# Patient Record
Sex: Male | Born: 1990 | Race: White | Hispanic: No | Marital: Single | Smoking: Current every day smoker
Health system: Southern US, Community
[De-identification: ages and names within clinical notes are randomized; demographics above are authoritative.]

## PROBLEM LIST (undated history)

## (undated) DIAGNOSIS — F909 Attention-deficit hyperactivity disorder, unspecified type: Secondary | ICD-10-CM

---
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
## 2011-01-06 ENCOUNTER — Emergency Department (HOSPITAL_COMMUNITY)
Admission: EM | Admit: 2011-01-06 | Discharge: 2011-01-06 | Payer: Self-pay | Source: Home / Self Care | Admitting: Emergency Medicine

## 2011-09-08 LAB — URINALYSIS, ROUTINE W REFLEX MICROSCOPIC
Ketones, ur: NEGATIVE
Leukocytes, UA: NEGATIVE
Nitrite: NEGATIVE
Protein, ur: NEGATIVE
Urobilinogen, UA: 1

## 2011-09-08 LAB — CBC
HCT: 43.3
MCV: 84.9
Platelets: 144 — ABNORMAL LOW
RDW: 13.1
WBC: 5.1

## 2011-09-08 LAB — DIFFERENTIAL
Basophils Absolute: 0
Eosinophils Absolute: 0
Eosinophils Relative: 0
Lymphs Abs: 0.6 — ABNORMAL LOW
Neutrophils Relative %: 76 — ABNORMAL HIGH

## 2011-09-08 LAB — URINE MICROSCOPIC-ADD ON: Urine-Other: NONE SEEN

## 2012-10-16 ENCOUNTER — Encounter (HOSPITAL_COMMUNITY): Payer: Self-pay

## 2012-10-16 ENCOUNTER — Emergency Department (HOSPITAL_COMMUNITY): Payer: Worker's Compensation

## 2012-10-16 ENCOUNTER — Emergency Department (HOSPITAL_COMMUNITY)
Admission: EM | Admit: 2012-10-16 | Discharge: 2012-10-16 | Disposition: A | Discharge status: Home / Self Care | Payer: Worker's Compensation | Attending: Emergency Medicine | Admitting: Emergency Medicine

## 2012-10-16 DIAGNOSIS — Z23 Encounter for immunization: Secondary | ICD-10-CM | POA: Insufficient documentation

## 2012-10-16 DIAGNOSIS — W278XXA Contact with other nonpowered hand tool, initial encounter: Secondary | ICD-10-CM | POA: Insufficient documentation

## 2012-10-16 DIAGNOSIS — Y9269 Other specified industrial and construction area as the place of occurrence of the external cause: Secondary | ICD-10-CM | POA: Insufficient documentation

## 2012-10-16 DIAGNOSIS — F17200 Nicotine dependence, unspecified, uncomplicated: Secondary | ICD-10-CM | POA: Insufficient documentation

## 2012-10-16 DIAGNOSIS — Y990 Civilian activity done for income or pay: Secondary | ICD-10-CM | POA: Insufficient documentation

## 2012-10-16 DIAGNOSIS — S61239A Puncture wound without foreign body of unspecified finger without damage to nail, initial encounter: Secondary | ICD-10-CM

## 2012-10-16 DIAGNOSIS — Y939 Activity, unspecified: Secondary | ICD-10-CM | POA: Insufficient documentation

## 2012-10-16 DIAGNOSIS — S61209A Unspecified open wound of unspecified finger without damage to nail, initial encounter: Secondary | ICD-10-CM | POA: Insufficient documentation

## 2012-10-16 MED ORDER — AMOXICILLIN-POT CLAVULANATE 875-125 MG PO TABS
1.0000 | ORAL_TABLET | Freq: Once | ORAL | Status: AC
  Administered 2012-10-16: 1 via ORAL
  Filled 2012-10-16: qty 1

## 2012-10-16 MED ORDER — IBUPROFEN 800 MG PO TABS
800.0000 mg | ORAL_TABLET | Freq: Once | ORAL | Status: AC
  Administered 2012-10-16: 800 mg via ORAL
  Filled 2012-10-16: qty 1

## 2012-10-16 MED ORDER — TETANUS-DIPHTH-ACELL PERTUSSIS 5-2.5-18.5 LF-MCG/0.5 IM SUSP
0.5000 mL | Freq: Once | INTRAMUSCULAR | Status: AC
  Administered 2012-10-16: 0.5 mL via INTRAMUSCULAR
  Filled 2012-10-16: qty 0.5

## 2012-10-16 MED ORDER — DOUBLE ANTIBIOTIC 500-10000 UNIT/GM EX OINT
TOPICAL_OINTMENT | Freq: Once | CUTANEOUS | Status: DC
  Administered 2012-10-16: 1 via TOPICAL

## 2012-10-16 MED ORDER — AMOXICILLIN-POT CLAVULANATE 875-125 MG PO TABS: 1.0000 | ORAL_TABLET | Freq: Two times a day (BID) | ORAL | Status: DC

## 2012-10-16 MED ORDER — DICLOFENAC SODIUM 75 MG PO TBEC: 75.0000 mg | DELAYED_RELEASE_TABLET | Freq: Two times a day (BID) | ORAL | Status: AC

## 2012-10-16 MED ORDER — DOUBLE ANTIBIOTIC 500-10000 UNIT/GM EX OINT
TOPICAL_OINTMENT | CUTANEOUS | Status: AC
  Filled 2012-10-16: qty 1

## 2012-10-16 MED ORDER — ONDANSETRON HCL 4 MG PO TABS
4.0000 mg | ORAL_TABLET | Freq: Once | ORAL | Status: AC
  Administered 2012-10-16: 4 mg via ORAL
  Filled 2012-10-16: qty 1

## 2012-10-16 MED ORDER — HYDROCODONE-ACETAMINOPHEN 5-325 MG PO TABS: 1.0000 | ORAL_TABLET | ORAL | Status: DC | PRN

## 2012-10-16 NOTE — ED Provider Notes (Signed)
History     CSN: [id number]  Arrival date & time 10/16/12  1478   First MD Initiated Contact with Patient 10/16/12 [phone]      Chief Complaint  Patient presents with  . Hand Pain    (Consider location/radiation/quality/duration/timing/severity/associated sxs/prior treatment) HPI Comments: Pt sustained a puncture wound to the left ring finger from a carpenters staple on yesterday. Today the area is swollen and tender to touch or movement. Pt is unsure of the date of the last tetanus. No fever or chills. No hx of previous procedure involving the left finger.  Patient is a 21 y.o. male presenting with hand pain. The history is provided by the patient.  Hand Pain Pertinent negatives include no abdominal pain, arthralgias, chest pain, coughing or neck pain.    History reviewed. No pertinent past medical history.  History reviewed. No pertinent past surgical history.  No family history on file.  History  Substance Use Topics  . Smoking status: Current Every Day Smoker  . Smokeless tobacco: Not on file  . Alcohol Use: No      Review of Systems  Constitutional: Negative for activity change.       All ROS Neg except as noted in HPI  HENT: Negative for nosebleeds and neck pain.   Eyes: Negative for photophobia and discharge.  Respiratory: Negative for cough, shortness of breath and wheezing.   Cardiovascular: Negative for chest pain and palpitations.  Gastrointestinal: Negative for abdominal pain and blood in stool.  Genitourinary: Negative for dysuria, frequency and hematuria.  Musculoskeletal: Negative for back pain and arthralgias.  Skin: Negative.   Neurological: Negative for dizziness, seizures and speech difficulty.  Psychiatric/Behavioral: Negative for hallucinations and confusion.    Allergies  Review of patient's allergies indicates no known allergies.  Home Medications  No current outpatient prescriptions on file.  BP 136/67  Pulse 88  Temp 98.6 F (37 C)  (Oral)  Resp 18  Ht 5\' 11"  (1.803 m)  Wt 165 lb (74.844 kg)  BMI 23.01 kg/m2  SpO2 100%  Physical Exam  Nursing note and vitals reviewed. Constitutional: He is oriented to person, place, and time. He appears well-developed and well-nourished.  Non-toxic appearance.  HENT:  Head: Normocephalic.  Right Ear: Tympanic membrane and external ear normal.  Left Ear: Tympanic membrane and external ear normal.  Eyes: EOM and lids are normal. Pupils are equal, round, and reactive to light.  Neck: Normal range of motion. Neck supple. Carotid bruit is not present.  Cardiovascular: Normal rate, regular rhythm, normal heart sounds, intact distal pulses and normal pulses.   Pulmonary/Chest: Breath sounds normal. No respiratory distress.  Abdominal: Soft. Bowel sounds are normal. There is no tenderness. There is no guarding.  Musculoskeletal: Normal range of motion.       Puncture wound to the palmar surface of the PIP of the left ring finger. Increase swelling and redness present. No red streaking or drainage. Good cap refill of the finger. Sensory intact. Pt can flex and extend the finger, but with discomfort.  Lymphadenopathy:       Head (right side): No submandibular adenopathy present.       Head (left side): No submandibular adenopathy present.    He has no cervical adenopathy.  Neurological: He is alert and oriented to person, place, and time. He has normal strength. No cranial nerve deficit or sensory deficit.  Skin: Skin is warm and dry.  Psychiatric: He has a normal mood and affect. His speech is normal.  ED Course  Procedures (including critical care time)  Labs Reviewed - No data to display No results found.   No diagnosis found.    MDM  I have reviewed nursing notes, vital signs, and all appropriate lab and imaging results for this patient. Pt sustained a puncture wound to the left ring finger yesterday (11/5), he now has redness and swelling present. Tetanus updated. Rx for  augmentin, ibuprofen 800mg , zofran, and norco given to the patient.       Kathie Dike, [place] 10/18/12 1350

## 2012-10-16 NOTE — ED Notes (Signed)
Pt reports something punctured left ring finger at work yesterday.  Says thinks may have been a staple.  Area red and swollen.  Last tetanus shot was greater than 5 years ago.

## 2012-10-19 NOTE — ED Provider Notes (Signed)
Medical screening examination/treatment/procedure(s) were performed by non-physician practitioner and as supervising physician I was immediately available for consultation/collaboration.   Demyah Smyre W Clare Fennimore, MD 10/19/12 2126 

## 2014-06-22 ENCOUNTER — Emergency Department (HOSPITAL_COMMUNITY)
Admission: EM | Admit: 2014-06-22 | Discharge: 2014-06-22 | Discharge status: Against Medical Advice | Payer: Self-pay | Attending: Emergency Medicine | Admitting: Emergency Medicine

## 2014-06-22 ENCOUNTER — Encounter (HOSPITAL_COMMUNITY): Payer: Self-pay | Admitting: Emergency Medicine

## 2014-06-22 DIAGNOSIS — R1110 Vomiting, unspecified: Secondary | ICD-10-CM | POA: Insufficient documentation

## 2014-06-22 DIAGNOSIS — R109 Unspecified abdominal pain: Secondary | ICD-10-CM | POA: Insufficient documentation

## 2014-06-22 NOTE — ED Notes (Signed)
Called patient to place in room. No answer. 

## 2014-06-22 NOTE — ED Notes (Addendum)
Patient complaining of sudden onset of right lower abdominal pain and vomiting starting one hour ago. Patient currently vomiting in triage.

## 2014-06-22 NOTE — ED Notes (Signed)
Called patient to place in room x 3. No answer.

## 2016-09-20 ENCOUNTER — Emergency Department (HOSPITAL_COMMUNITY)
Admission: EM | Admit: 2016-09-20 | Discharge: 2016-09-20 | Disposition: A | Discharge status: Home / Self Care | Payer: Self-pay | Attending: Emergency Medicine | Admitting: Emergency Medicine

## 2016-09-20 ENCOUNTER — Encounter (HOSPITAL_COMMUNITY): Payer: Self-pay | Admitting: Emergency Medicine

## 2016-09-20 DIAGNOSIS — Z79899 Other long term (current) drug therapy: Secondary | ICD-10-CM | POA: Insufficient documentation

## 2016-09-20 DIAGNOSIS — F17210 Nicotine dependence, cigarettes, uncomplicated: Secondary | ICD-10-CM | POA: Insufficient documentation

## 2016-09-20 DIAGNOSIS — L249 Irritant contact dermatitis, unspecified cause: Secondary | ICD-10-CM | POA: Insufficient documentation

## 2016-09-20 DIAGNOSIS — Z792 Long term (current) use of antibiotics: Secondary | ICD-10-CM | POA: Insufficient documentation

## 2016-09-20 MED ORDER — PREDNISONE 50 MG PO TABS
60.0000 mg | ORAL_TABLET | Freq: Once | ORAL | Status: AC
  Administered 2016-09-20: 60 mg via ORAL
  Filled 2016-09-20: qty 1

## 2016-09-20 MED ORDER — DIPHENHYDRAMINE HCL 25 MG PO CAPS
25.0000 mg | ORAL_CAPSULE | Freq: Once | ORAL | Status: AC
  Administered 2016-09-20: 25 mg via ORAL
  Filled 2016-09-20: qty 1

## 2016-09-20 MED ORDER — PREDNISONE 10 MG PO TABS: ORAL_TABLET | ORAL | 0 refills | Status: DC

## 2016-09-20 NOTE — ED Provider Notes (Signed)
[facility] Provider Note   CSN: [id number] Arrival date & time: 09/20/16  1457     History   Chief Complaint Chief Complaint  Patient presents with  . Rash    HPI Daniel KelpRyan A Park is a 25 y.o. male.  HPI   Daniel KelpRyan A Tuckey is a 25 y.o. male who presents to the Emergency Department complaining of itching and rash to bilateral hand and arms.  Rash present for one week.  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  He denies pain, swelling or open lesions.  He has tried OTC creams without relief.  Also denies rash anywhere other than hands and lower arms.  History reviewed. No pertinent past medical history.  There are no active problems to display for this patient.   History reviewed. No pertinent surgical history.     Home Medications    Prior to Admission medications   Medication Sig Start Date End Date Taking? Authorizing Provider  amoxicillin-clavulanate (AUGMENTIN) 875-125 MG per tablet Take 1 tablet by mouth 2 (two) times daily. 10/16/12   Ivery QualeHobson Bryant, PA-C  HYDROcodone-acetaminophen (NORCO) 5-325 MG per tablet Take 1 tablet by mouth every 4 (four) hours as needed for pain. 10/16/12   Ivery QualeHobson Bryant, PA-C  predniSONE (DELTASONE) 10 MG tablet Take 6 tablets day one, 5 tablets day two, 4 tablets day three, 3 tablets day four, 2 tablets day five, then 1 tablet day six 09/20/16   Pauline Ausammy Shaili Donalson, PA-C    Family History History reviewed. No pertinent family history.  Social History Social History  Substance Use Topics  . Smoking status: Current Every Day Smoker    Packs/day: 0.50  . Smokeless tobacco: Never Used  . Alcohol use No     Allergies   Review of patient's allergies indicates no known allergies.   Review of Systems Review of Systems  Constitutional: Negative for activity change, appetite change, chills and fever.  HENT: Negative for facial swelling, sore throat and trouble swallowing.   Respiratory:  Negative for chest tightness, shortness of breath and wheezing.   Musculoskeletal: Negative for neck pain and neck stiffness.  Skin: Positive for rash. Negative for wound.  Neurological: Negative for dizziness, weakness, numbness and headaches.  All other systems reviewed and are negative.    Physical Exam Updated Vital Signs BP 113/72 (BP Location: Left Arm)   Pulse 100   Temp 98.6 F (37 C) (Oral)   Resp 18   Ht 5\' 11"  (1.803 m)   Wt 68 kg   SpO2 98%   BMI 20.92 kg/m   Physical Exam  Constitutional: He is oriented to person, place, and time. He appears well-developed and well-nourished. No distress.  HENT:  Head: Normocephalic and atraumatic.  Mouth/Throat: Oropharynx is clear and moist.  Neck: Normal range of motion. Neck supple.  Cardiovascular: Normal rate, regular rhythm and intact distal pulses.   No murmur heard. Pulmonary/Chest: Effort normal and breath sounds normal. No respiratory distress.  Musculoskeletal: Normal range of motion. He exhibits no edema or tenderness.  Lymphadenopathy:    He has no cervical adenopathy.  Neurological: He is alert and oriented to person, place, and time. He exhibits normal muscle tone. Coordination normal.  Skin: Skin is warm. Rash noted. There is erythema.  Erythematous maculopapular rash to the dorsal hands and forearms.  No edema, vesicles or pustules. Palms and web spaces are spared  Nursing note and vitals reviewed.    ED  Treatments / Results  Labs (all labs ordered are listed, but only abnormal results are displayed) Labs Reviewed - No data to display  EKG  EKG Interpretation None       Radiology No results found.  Procedures Procedures (including critical care time)  Medications Ordered in ED Medications  predniSONE (DELTASONE) tablet 60 mg (not administered)  diphenhydrAMINE (BENADRYL) capsule 25 mg (not administered)     Initial Impression / Assessment and Plan / ED Course  I have reviewed the triage  vital signs and the nursing notes.  Pertinent labs & imaging results that were available during my care of the patient were reviewed by me and considered in my medical decision making (see chart for details).  Clinical Course    Rash to arms and hands that appears c/w contact dermatitis.  Palms are spared.  NV intact Pt agrees to tx and derm f/u if not improving  Final Clinical Impressions(s) / ED Diagnoses   Final diagnoses:  Irritant contact dermatitis, unspecified trigger    New Prescriptions New Prescriptions   PREDNISONE (DELTASONE) 10 MG TABLET    Take 6 tablets day one, 5 tablets day two, 4 tablets day three, 3 tablets day four, 2 tablets day five, then 1 tablet day six     Pauline Aus, PA-C 09/22/16 2321    Bethann Berkshire, MD 09/23/16 [phone]

## 2016-09-20 NOTE — ED Triage Notes (Signed)
Pt report rash for last several days. Red raised areas noted to bilateral wrists and hands. Pt denies any changes or new self hygiene products.

## 2016-09-20 NOTE — Discharge Instructions (Signed)
Wash her hands often with mild soap and water. Apply over-the-counter 1% hydrocortisone cream 3 times daily as needed. Take over-the-counter Benadryl 25 mg capsule every 4 hours as needed for itching. Follow-up with your doctor or return here for any worsening symptoms.

## 2017-08-04 ENCOUNTER — Emergency Department (HOSPITAL_COMMUNITY)
Admission: EM | Admit: 2017-08-04 | Discharge: 2017-08-04 | Disposition: A | Discharge status: Home / Self Care | Payer: Self-pay | Attending: Emergency Medicine | Admitting: Emergency Medicine

## 2017-08-04 ENCOUNTER — Encounter (HOSPITAL_COMMUNITY): Payer: Self-pay | Admitting: Adult Health

## 2017-08-04 DIAGNOSIS — L259 Unspecified contact dermatitis, unspecified cause: Secondary | ICD-10-CM | POA: Insufficient documentation

## 2017-08-04 DIAGNOSIS — L309 Dermatitis, unspecified: Secondary | ICD-10-CM

## 2017-08-04 DIAGNOSIS — F17210 Nicotine dependence, cigarettes, uncomplicated: Secondary | ICD-10-CM | POA: Insufficient documentation

## 2017-08-04 MED ORDER — PREDNISONE 10 MG PO TABS
ORAL_TABLET | ORAL | 0 refills | Status: DC
Start: 2017-08-04 — End: 2019-09-15

## 2017-08-04 MED ORDER — DIPHENHYDRAMINE HCL 25 MG PO CAPS
25.0000 mg | ORAL_CAPSULE | Freq: Once | ORAL | Status: AC
  Administered 2017-08-04: 25 mg via ORAL
  Filled 2017-08-04: qty 1

## 2017-08-04 MED ORDER — TRIAMCINOLONE ACETONIDE 0.1 % EX CREA
1.0000 | TOPICAL_CREAM | Freq: Three times a day (TID) | CUTANEOUS | 0 refills | Status: DC
Start: 2017-08-04 — End: 2019-09-15

## 2017-08-04 MED ORDER — PREDNISONE 50 MG PO TABS
60.0000 mg | ORAL_TABLET | Freq: Once | ORAL | Status: AC
  Administered 2017-08-04: 60 mg via ORAL
  Filled 2017-08-04: qty 1

## 2017-08-04 NOTE — ED Notes (Signed)
Pt with rash to right forearm since dec 2017/jan 2018 that itches.

## 2017-08-04 NOTE — ED Triage Notes (Signed)
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 Endorses itching and burning 10/10.

## 2017-08-04 NOTE — Discharge Instructions (Signed)
Take OTC benadryl one capsule every 4-6 hrs as needed for itching.  Call the skin doctor listed to arrange a follow-up if not improving

## 2017-08-04 NOTE — ED Notes (Signed)
ED Provider at bedside. 

## 2017-08-04 NOTE — ED Provider Notes (Signed)
[facility] Provider Note   CSN: [id number] Arrival date & time: 08/04/17  1504     History   Chief Complaint Chief Complaint  Patient presents with  . Rash    HPI Daniel Park is a 26 y.o. male.  HPI   Daniel Park is a 26 y.o. male who presents to the Emergency Department complaining of persistent rash to bilateral wrists and right forearm.  Rash has been present for one year.  He was seen here in December for same, but did not get the medications filled.  He describes a burning, itching sensation that is persistent.  Complains of pain with scratching.  Nothing makes it better or worse.  He denies swelling, drainage, numbness of the fingers or rash elsewhere.     History reviewed. No pertinent past medical history.  There are no active problems to display for this patient.   History reviewed. No pertinent surgical history.     Home Medications    Prior to Admission medications   Medication Sig Start Date End Date Taking? Authorizing Provider  amoxicillin-clavulanate (AUGMENTIN) 875-125 MG per tablet Take 1 tablet by mouth 2 (two) times daily. 10/16/12   Ivery Quale, PA-C  HYDROcodone-acetaminophen (NORCO) 5-325 MG per tablet Take 1 tablet by mouth every 4 (four) hours as needed for pain. 10/16/12   Ivery Quale, PA-C  predniSONE (DELTASONE) 10 MG tablet Take 6 tablets day one, 5 tablets day two, 4 tablets day three, 3 tablets day four, 2 tablets day five, then 1 tablet day six 09/20/16   Pauline Aus, PA-C    Family History History reviewed. No pertinent family history.  Social History Social History  Substance Use Topics  . Smoking status: Current Every Day Smoker    Packs/day: 0.50  . Smokeless tobacco: Never Used  . Alcohol use No     Allergies   Patient has no known allergies.   Review of Systems Review of Systems  Constitutional: Negative for activity change, appetite change, chills and fever.  HENT: Negative for facial swelling,  sore throat and trouble swallowing.   Respiratory: Negative for chest tightness, shortness of breath and wheezing.   Musculoskeletal: Negative for arthralgias, neck pain and neck stiffness.  Skin: Positive for rash. Negative for wound.  Neurological: Negative for dizziness, weakness, numbness and headaches.  Hematological: Negative for adenopathy.  All other systems reviewed and are negative.    Physical Exam Updated Vital Signs BP 130/84   Pulse 65   Temp 99 F (37.2 C) (Oral)   Resp 18   SpO2 100%   Physical Exam  Constitutional: He is oriented to person, place, and time. He appears well-developed and well-nourished. No distress.  HENT:  Head: Normocephalic and atraumatic.  Mouth/Throat: Oropharynx is clear and moist.  Neck: Normal range of motion. Neck supple.  Cardiovascular: Normal rate, regular rhythm and intact distal pulses.   No murmur heard. Pulmonary/Chest: Effort normal and breath sounds normal. No respiratory distress.  Musculoskeletal: He exhibits no edema or tenderness.  Lymphadenopathy:    He has no cervical adenopathy.  Neurological: He is alert and oriented to person, place, and time. No sensory deficit. He exhibits normal muscle tone. Coordination normal.  Skin: Skin is warm. Capillary refill takes less than 2 seconds. Rash noted. There is erythema.  Erythematous papules and excoriations to the right forearm and bilateral wrists.  No edema. No vesicles.   Nursing note and vitals reviewed.    ED Treatments / Results  Labs (  all labs ordered are listed, but only abnormal results are displayed) Labs Reviewed - No data to display  EKG  EKG Interpretation None       Radiology No results found.  Procedures Procedures (including critical care time)  Medications Ordered in ED Medications  predniSONE (DELTASONE) tablet 60 mg (not administered)  diphenhydrAMINE (BENADRYL) capsule 25 mg (not administered)     Initial Impression / Assessment and  Plan / ED Course  I have reviewed the triage vital signs and the nursing notes.  Pertinent labs & imaging results that were available during my care of the patient were reviewed by me and considered in my medical decision making (see chart for details).     Non-specific rash.  Appears c/w a dermatitis.  Seen here one year ago for same, but did not fill his prescriptions. Doubt infectious source.  Final Clinical Impressions(s) / ED Diagnoses   Final diagnoses:  Dermatitis    New Prescriptions New Prescriptions   No medications on file     Pauline Aus, PA-C 08/04/17 1643    Samuel Jester, DO 08/05/17 2253

## 2017-08-06 ENCOUNTER — Encounter (HOSPITAL_COMMUNITY): Payer: Self-pay | Admitting: Emergency Medicine

## 2017-08-06 ENCOUNTER — Emergency Department (HOSPITAL_COMMUNITY)
Admission: EM | Admit: 2017-08-06 | Discharge: 2017-08-06 | Disposition: A | Discharge status: Home / Self Care | Payer: Self-pay | Attending: Emergency Medicine | Admitting: Emergency Medicine

## 2017-08-06 DIAGNOSIS — K649 Unspecified hemorrhoids: Secondary | ICD-10-CM | POA: Insufficient documentation

## 2017-08-06 DIAGNOSIS — Z79899 Other long term (current) drug therapy: Secondary | ICD-10-CM | POA: Insufficient documentation

## 2017-08-06 DIAGNOSIS — F17210 Nicotine dependence, cigarettes, uncomplicated: Secondary | ICD-10-CM | POA: Insufficient documentation

## 2017-08-06 DIAGNOSIS — K644 Residual hemorrhoidal skin tags: Secondary | ICD-10-CM

## 2017-08-06 MED ORDER — DOCUSATE SODIUM 250 MG PO CAPS: 250.0000 mg | ORAL_CAPSULE | Freq: Two times a day (BID) | ORAL | 0 refills | Status: DC | PRN

## 2017-08-06 MED ORDER — HYDROCORTISONE ACETATE 25 MG RE SUPP
25.0000 mg | Freq: Two times a day (BID) | RECTAL | 0 refills | Status: DC
Start: 2017-08-06 — End: 2019-09-15

## 2017-08-06 NOTE — Discharge Instructions (Signed)
Use the medicines prescribed.  As discussed,  a warm Sitz bath which can be purchased at any drug store along with the medicine prescribed can help to relieve this hemorrhoid quicker.

## 2017-08-06 NOTE — ED Notes (Signed)
Pt states he has had hemorrhoids in the past started having pain last night, OTC meds not helping.

## 2017-08-06 NOTE — ED Triage Notes (Signed)
Pt c/o hemorrhoid pain since last night, he states they are worse than they have ever been.

## 2017-08-06 NOTE — ED Notes (Signed)
Pt alert & oriented x4, stable gait. Patient given discharge instructions, paperwork & prescription(s). Patient  instructed to stop at the registration desk to finish any additional paperwork. Patient verbalized understanding. Pt left department w/ no further questions. 

## 2017-08-08 NOTE — ED Provider Notes (Signed)
[facility] Provider Note   CSN: [id number] Arrival date & time: 08/06/17  1827     History   Chief Complaint Chief Complaint  Patient presents with  . Rectal Pain    HPI Daniel Park is a 26 y.o. male with a history of occasional episodes of hemorrhoid which he relates to episodic constipation present with painful hemorrhoidal swellling which flared last night.  He denies abdominal, pelvic or back pain and has had no fevers, chills or diarrhea.  He has seen a small amount of blood on the toilet tissue but otherwise no significant rectal bleeding.  He has had no treatments prior to arrival.  The history is provided by the patient.    History reviewed. No pertinent past medical history.  There are no active problems to display for this patient.   History reviewed. No pertinent surgical history.     Home Medications    Prior to Admission medications   Medication Sig Start Date End Date Taking? Authorizing Provider  amoxicillin-clavulanate (AUGMENTIN) 875-125 MG per tablet Take 1 tablet by mouth 2 (two) times daily. 10/16/12   Ivery Quale, PA-C  docusate sodium (COLACE) 250 MG capsule Take 1 capsule (250 mg total) by mouth 2 (two) times daily as needed for constipation. 08/06/17   Burgess Amor, PA-C  HYDROcodone-acetaminophen (NORCO) 5-325 MG per tablet Take 1 tablet by mouth every 4 (four) hours as needed for pain. 10/16/12   Ivery Quale, PA-C  hydrocortisone (ANUSOL-HC) 25 MG suppository Place 1 suppository (25 mg total) rectally 2 (two) times daily. 08/06/17   Burgess Amor, PA-C  predniSONE (DELTASONE) 10 MG tablet Take 6 tablets day one, 5 tablets day two, 4 tablets day three, 3 tablets day four, 2 tablets day five, then 1 tablet day six 08/04/17   Triplett, Tammy, PA-C  triamcinolone cream (KENALOG) 0.1 % Apply 1 application topically 3 (three) times daily. 08/04/17   Pauline Aus, PA-C    Family History No family history on file.  Social History Social  History  Substance Use Topics  . Smoking status: Current Every Day Smoker    Packs/day: 0.50  . Smokeless tobacco: Never Used  . Alcohol use No     Allergies   Patient has no known allergies.   Review of Systems Review of Systems  Constitutional: Negative for chills and fever.  HENT: Negative for congestion and sore throat.   Eyes: Negative.   Respiratory: Negative for chest tightness and shortness of breath.   Cardiovascular: Negative for chest pain.  Gastrointestinal: Positive for anal bleeding, constipation and rectal pain. Negative for abdominal pain, nausea and vomiting.  Genitourinary: Negative.   Musculoskeletal: Negative for arthralgias, joint swelling and neck pain.  Skin: Negative.  Negative for rash and wound.  Neurological: Negative for dizziness, weakness, light-headedness, numbness and headaches.  Psychiatric/Behavioral: Negative.      Physical Exam Updated Vital Signs BP 119/68 (BP Location: Right Arm)   Pulse 72   Temp 98.6 F (37 C) (Oral)   Resp 15   Ht 5\' 10"  (1.778 m)   Wt 72.6 kg (160 lb)   SpO2 99%   BMI 22.96 kg/m   Physical Exam  Constitutional: He appears well-developed and well-nourished.  HENT:  Head: Normocephalic and atraumatic.  Cardiovascular: Normal rate.   Pulmonary/Chest: Effort normal.  Abdominal: Soft. Bowel sounds are normal. There is no tenderness.  Genitourinary: Rectal exam shows external hemorrhoid.  Genitourinary Comments: No thrombosis. Located along right anal verge.  Musculoskeletal: Normal range  of motion.  Neurological: He is alert.  Skin: Skin is warm and dry.  Psychiatric: He has a normal mood and affect.  Nursing note and vitals reviewed.    ED Treatments / Results  Labs (all labs ordered are listed, but only abnormal results are displayed) Labs Reviewed - No data to display  EKG  EKG Interpretation None       Radiology No results found.  Procedures Procedures (including critical care  time)  Pressure applied to hemorrhoid with moderate reduction in size and pain.    Medications Ordered in ED Medications - No data to display   Initial Impression / Assessment and Plan / ED Course  I have reviewed the triage vital signs and the nursing notes.  Pertinent labs & imaging results that were available during my care of the patient were reviewed by me and considered in my medical decision making (see chart for details).     anusol suppositories/ warm sitz baths discussed, colace to help with constipation.  Advised f/u with general surgery for any peristent or worsened sx.    Final Clinical Impressions(s) / ED Diagnoses   Final diagnoses:  External hemorrhoid    New Prescriptions Discharge Medication List as of 08/06/2017  8:07 PM    START taking these medications   Details  docusate sodium (COLACE) 250 MG capsule Take 1 capsule (250 mg total) by mouth 2 (two) times daily as needed for constipation., Starting Mon 08/06/2017, Print    hydrocortisone (ANUSOL-HC) 25 MG suppository Place 1 suppository (25 mg total) rectally 2 (two) times daily., Starting Mon 08/06/2017, Print         Burgess Amor, PA-C 08/08/17 1312    Eber Hong, MD 08/14/17 [phone]

## 2019-09-15 ENCOUNTER — Encounter (HOSPITAL_COMMUNITY): Payer: Self-pay

## 2019-09-15 ENCOUNTER — Other Ambulatory Visit: Payer: Self-pay

## 2019-09-15 ENCOUNTER — Emergency Department (HOSPITAL_COMMUNITY)
Admission: EM | Admit: 2019-09-15 | Discharge: 2019-09-15 | Disposition: A | Discharge status: Home / Self Care | Payer: Self-pay | Attending: Emergency Medicine | Admitting: Emergency Medicine

## 2019-09-15 ENCOUNTER — Emergency Department (HOSPITAL_COMMUNITY): Payer: Self-pay

## 2019-09-15 DIAGNOSIS — R112 Nausea with vomiting, unspecified: Secondary | ICD-10-CM | POA: Insufficient documentation

## 2019-09-15 DIAGNOSIS — Y998 Other external cause status: Secondary | ICD-10-CM | POA: Insufficient documentation

## 2019-09-15 DIAGNOSIS — R55 Syncope and collapse: Secondary | ICD-10-CM | POA: Insufficient documentation

## 2019-09-15 DIAGNOSIS — S61111A Laceration without foreign body of right thumb with damage to nail, initial encounter: Secondary | ICD-10-CM | POA: Insufficient documentation

## 2019-09-15 DIAGNOSIS — Y929 Unspecified place or not applicable: Secondary | ICD-10-CM | POA: Insufficient documentation

## 2019-09-15 DIAGNOSIS — F17210 Nicotine dependence, cigarettes, uncomplicated: Secondary | ICD-10-CM | POA: Insufficient documentation

## 2019-09-15 DIAGNOSIS — Y9389 Activity, other specified: Secondary | ICD-10-CM | POA: Insufficient documentation

## 2019-09-15 DIAGNOSIS — W260XXA Contact with knife, initial encounter: Secondary | ICD-10-CM | POA: Insufficient documentation

## 2019-09-15 HISTORY — DX: Attention-deficit hyperactivity disorder, unspecified type: F90.9

## 2019-09-15 LAB — BASIC METABOLIC PANEL
Anion gap: 11 (ref 5–15)
BUN: 15 mg/dL (ref 6–20)
CO2: 24 mmol/L (ref 22–32)
Calcium: 9 mg/dL (ref 8.9–10.3)
Chloride: 101 mmol/L (ref 98–111)
Creatinine, Ser: 1.04 mg/dL (ref 0.61–1.24)
GFR calc Af Amer: >60 mL/min (ref >60)
GFR calc non Af Amer: >60 mL/min (ref >60)
Glucose, Bld: 92 mg/dL (ref 70–99)
Potassium: 4.1 mmol/L (ref 3.5–5.1)
Sodium: 136 mmol/L (ref 135–145)

## 2019-09-15 LAB — CBC
HCT: 46.6 % (ref 39.0–52.0)
Hemoglobin: 15.9 g/dL (ref 13.0–17.0)
MCH: 29.9 pg (ref 26.0–34.0)
MCHC: 34.1 g/dL (ref 30.0–36.0)
MCV: 87.8 fL (ref 80.0–100.0)
Platelets: 208 10*3/uL (ref 150–400)
RBC: 5.31 MIL/uL (ref 4.22–5.81)
RDW: 12.4 % (ref 11.5–15.5)
WBC: 11 10*3/uL — ABNORMAL HIGH (ref 4.0–10.5)
nRBC: 0 % (ref 0.0–0.2)

## 2019-09-15 LAB — CBG MONITORING, ED: Glucose-Capillary: 84 mg/dL (ref 70–99)

## 2019-09-15 MED ORDER — IBUPROFEN 800 MG PO TABS
800.0000 mg | ORAL_TABLET | Freq: Once | ORAL | Status: AC
  Administered 2019-09-15: 800 mg via ORAL
  Filled 2019-09-15: qty 1

## 2019-09-15 MED ORDER — CEPHALEXIN 500 MG PO CAPS
500.0000 mg | ORAL_CAPSULE | Freq: Once | ORAL | Status: AC
  Administered 2019-09-15: 500 mg via ORAL
  Filled 2019-09-15: qty 1

## 2019-09-15 MED ORDER — CEPHALEXIN 500 MG PO CAPS: 500.0000 mg | ORAL_CAPSULE | Freq: Four times a day (QID) | ORAL | 0 refills | Status: AC

## 2019-09-15 MED ORDER — SODIUM CHLORIDE 0.9% FLUSH: 3.0000 mL | Freq: Once | INTRAVENOUS | Status: DC

## 2019-09-15 NOTE — ED Triage Notes (Signed)
Pt presents to ED with complaints of laceration to right thumb nail from box cutter trying to cut bamboo. Pt states after he cut his nail he passed out and hit his head. Pt vomited x 1 approx 200 ml.

## 2019-09-15 NOTE — ED Provider Notes (Signed)
[facility] EMERGENCY DEPARTMENT Provider Note   CSN: [id number] Arrival date & time: 09/15/19  1456     History   Chief Complaint Chief Complaint  Patient presents with   Loss of Consciousness   Laceration    HPI Daniel Park is a 28 y.o. male.     Patient states that he was cutting bamboo, his knife slipped and he cut his thumb.  The patient states that shortly after that he had a passing out episode.  The patient states that he does not do well with blood or pain.  He has had problems with passing out under the circumstances in the past.  He was easily revived without problem.  He says he has no lapse in memory he remembers all the events of the injury.  He remembers being nauseated and having 1 episode of vomiting.  He did not have any problem mobilizing the vomitus, and does not feel that he aspirated.  He has not had any cough or shortness of breath since that time.  He presents now for evaluation concerning the laceration of his thumb, and also to make sure that there are no other causes of his passing out.  The history is provided by the patient.  Laceration Location:  Finger Finger laceration location:  R thumb Associated symptoms: no rash     Past Medical History:  Diagnosis Date   ADHD     There are no active problems to display for this patient.   Past Surgical History:  Procedure Laterality Date   wisdon teeth          Home Medications    Prior to Admission medications   Medication Sig Start Date End Date Taking? Authorizing Provider  ibuprofen (ADVIL) 200 MG tablet Take 400 mg by mouth every 6 (six) hours as needed for mild pain or moderate pain.   Yes [provider]    Family History No family history on file.  Social History Social History   Tobacco Use   Smoking status: Current Every Day Smoker    Packs/day: 0.50   Smokeless tobacco: Never Used  Substance Use Topics   Alcohol use: Yes    Comment: occ beer   Drug  use: No     Allergies   Patient has no known allergies.   Review of Systems Review of Systems  Constitutional: Negative for activity change and appetite change.  HENT: Negative for congestion, ear discharge, ear pain, facial swelling, nosebleeds, rhinorrhea, sneezing and tinnitus.   Eyes: Negative for photophobia, pain and discharge.  Respiratory: Negative for cough, choking, shortness of breath and wheezing.   Cardiovascular: Negative for chest pain, palpitations and leg swelling.  Gastrointestinal: Negative for abdominal pain, blood in stool, constipation, diarrhea, nausea and vomiting.  Genitourinary: Negative for difficulty urinating, dysuria, flank pain, frequency and hematuria.  Musculoskeletal: Negative for back pain, gait problem, myalgias and neck pain.  Skin: Positive for wound. Negative for color change and rash.       Laceration thumb  Neurological: Positive for syncope. Negative for dizziness, seizures, facial asymmetry, speech difficulty, weakness and numbness.  Hematological: Negative for adenopathy. Does not bruise/bleed easily.  Psychiatric/Behavioral: Negative for agitation, confusion, hallucinations, self-injury and suicidal ideas. The patient is not nervous/anxious.      Physical Exam Updated Vital Signs BP 119/74 (BP Location: Right Arm)    Pulse (!) 52    Temp 98.5 F (36.9 C) (Oral)    Resp 18    Ht  5\' 10"  (1.778 m)    Wt 70.3 kg    SpO2 100%    BMI 22.24 kg/m   Physical Exam Vitals signs and nursing note reviewed.  Constitutional:      General: He is not in acute distress.    Appearance: He is well-developed.  HENT:     Head: Normocephalic and atraumatic.     Right Ear: External ear normal.     Left Ear: External ear normal.  Eyes:     General: No scleral icterus.       Right eye: No discharge.        Left eye: No discharge.     Conjunctiva/sclera: Conjunctivae normal.  Neck:     Musculoskeletal: Neck supple.     Trachea: No tracheal deviation.    Cardiovascular:     Rate and Rhythm: Normal rate and regular rhythm.  Pulmonary:     Effort: Pulmonary effort is normal. No respiratory distress.     Breath sounds: Normal breath sounds. No stridor. No wheezing or rales.  Abdominal:     General: Bowel sounds are normal. There is no distension.     Palpations: Abdomen is soft.     Tenderness: There is no abdominal tenderness. There is no guarding or rebound.  Musculoskeletal:     Right hand: He exhibits tenderness and laceration.       Hands:  Skin:    General: Skin is warm and dry.     Findings: No rash.  Neurological:     Mental Status: He is alert.     GCS: GCS eye subscore is 4. GCS verbal subscore is 5. GCS motor subscore is 6.     Cranial Nerves: No cranial nerve deficit (no facial droop, extraocular movements intact, no slurred speech).     Sensory: No sensory deficit.     Motor: No abnormal muscle tone or seizure activity.     Coordination: Coordination normal. Finger-Nose-Finger Test normal.     Gait: Gait is intact.      ED Treatments / Results  Labs (all labs ordered are listed, but only abnormal results are displayed) Labs Reviewed  CBC - Abnormal; Notable for the following components:      Result Value   WBC 11.0 (*)    All other components within normal limits  BASIC METABOLIC PANEL  URINALYSIS, ROUTINE W REFLEX MICROSCOPIC  CBG MONITORING, ED    EKG None  Radiology Dg Finger Thumb Left  Result Date: 09/15/2019 CLINICAL DATA:  Pt presents to ED with complaints of laceration to right thumb nail from box cutter trying to cut bamboo. Pt states after he cut his nail he passed out and hit his head. EXAM: LEFT THUMB 2+V COMPARISON:  None. FINDINGS: There is no evidence of fracture or dislocation. There is no evidence of arthropathy or other focal bone abnormality. No radiopaque foreign body in the regional soft tissues. IMPRESSION: No acute osseous abnormality in the left thumb. Electronically Signed   By:  11/15/2019 M.D.   On: 09/15/2019 15:33    Procedures Procedures (including critical care time)  Medications Ordered in ED Medications  sodium chloride flush (NS) 0.9 % injection 3 mL (0 mLs Intravenous Hold 09/15/19 1755)     Initial Impression / Assessment and Plan / ED Course  I have reviewed the triage vital signs and the nursing notes.  Pertinent labs & imaging results that were available during my care of the patient were reviewed by me and  considered in my medical decision making (see chart for details).          Final Clinical Impressions(s) / ED Diagnoses MDM  The laceration to the right thumb involves the thumb nail.  There is no bleeding at this time. The laceration is not a candidate for suture repair.  Sterile dressing was applied to the area.  I have reviewed the x-ray of the thumb.  There is no bony abnormality. We reviewed the patient's tetanus status. Patient will be started on antibiotic therapy due to the mechanism of the injury and the laceration to the thumb.  I discussed with the patient that his thumbnail should continue to grow and as it grows that he should file away the lacerated area. CBG was 84, complete blood count and basic metabolic panel are nonacute.  No gross neurologic deficits appreciated on examination.  The syncopal episode favors a vasovagal response.  Patient is given instructions to return to the emergency department if any signs of advancing infection.  Patient is in agreement with this plan.   Final diagnoses:  Vasovagal episode  Laceration of right thumb without foreign body with damage to nail, initial encounter    ED Discharge Orders         Ordered    cephALEXin (KEFLEX) 500 MG capsule  4 times daily     09/15/19 1855           Ivery QualeBryant, Creg Gilmer, PA-C 09/18/19 [id number]    Sabas SousBero, Michael M, MD 09/20/19 1055

## 2019-09-15 NOTE — Discharge Instructions (Addendum)
Your passing out episode is probably related to the pain, or the site of your own blood.  Please cleanse the wound to your thumb daily with soap and water.  Apply a dressing over the next 3 to 4 days.  After that you may use a large Band-Aid.  Please use Keflex with breakfast, lunch, dinner, and at bedtime until all taken.  Please see your primary physician or return to the emergency department if there are any red streaks going up your hand, pus like drainage from the wound, high fever, or signs of advancing infection.

## 2021-02-15 ENCOUNTER — Other Ambulatory Visit: Payer: Self-pay

## 2021-02-15 ENCOUNTER — Emergency Department (HOSPITAL_COMMUNITY)
Admission: EM | Admit: 2021-02-15 | Discharge: 2021-02-15 | Disposition: A | Discharge status: Home / Self Care | Payer: Managed Care, Other (non HMO) | Attending: Emergency Medicine | Admitting: Emergency Medicine

## 2021-02-15 ENCOUNTER — Encounter (HOSPITAL_COMMUNITY): Payer: Self-pay | Admitting: Emergency Medicine

## 2021-02-15 DIAGNOSIS — S34109A Unspecified injury to unspecified level of lumbar spinal cord, initial encounter: Secondary | ICD-10-CM | POA: Diagnosis present

## 2021-02-15 DIAGNOSIS — X58XXXA Exposure to other specified factors, initial encounter: Secondary | ICD-10-CM | POA: Insufficient documentation

## 2021-02-15 DIAGNOSIS — F17210 Nicotine dependence, cigarettes, uncomplicated: Secondary | ICD-10-CM | POA: Diagnosis not present

## 2021-02-15 DIAGNOSIS — S39012A Strain of muscle, fascia and tendon of lower back, initial encounter: Secondary | ICD-10-CM | POA: Diagnosis not present

## 2021-02-15 MED ORDER — METHOCARBAMOL 500 MG PO TABS: 500.0000 mg | ORAL_TABLET | Freq: Two times a day (BID) | ORAL | 0 refills | Status: AC

## 2021-02-15 MED ORDER — KETOROLAC TROMETHAMINE 30 MG/ML IJ SOLN
30.0000 mg | Freq: Once | INTRAMUSCULAR | Status: AC
  Administered 2021-02-15: 30 mg via INTRAMUSCULAR
  Filled 2021-02-15: qty 1

## 2021-02-15 MED ORDER — IBUPROFEN 600 MG PO TABS: 600.0000 mg | ORAL_TABLET | Freq: Four times a day (QID) | ORAL | 0 refills | Status: AC | PRN

## 2021-02-15 NOTE — ED Provider Notes (Signed)
[facility] EMERGENCY DEPARTMENT Provider Note   CSN: [id number] Arrival date & time: 02/15/21  2025     History Chief Complaint  Patient presents with  . Back Pain    Daniel Park is a 30 y.o. male.  Pt presents to the ED today with back pain.  Sx started upon wakening this morning.  The pt said he is not sure what he did.  The pt denies any pain or numbness in his legs.  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  He has no hx of back problems.  He did try icy hot as well as a heating pad.  No meds.        Past Medical History:  Diagnosis Date  . ADHD     There are no problems to display for this patient.   Past Surgical History:  Procedure Laterality Date  . wisdon teeth         History reviewed. No pertinent family history.  Social History   Tobacco Use  . Smoking status: Current Every Day Smoker    Packs/day: 0.50  . Smokeless tobacco: Never Used  Substance Use Topics  . Alcohol use: Yes    Comment: occ beer  . Drug use: No    Home Medications Prior to Admission medications   Medication Sig Start Date End Date Taking? Authorizing Provider  ibuprofen (ADVIL) 600 MG tablet Take 1 tablet (600 mg total) by mouth every 6 (six) hours as needed. 02/15/21  Yes Jacalyn Lefevre, MD  methocarbamol (ROBAXIN) 500 MG tablet Take 1 tablet (500 mg total) by mouth 2 (two) times daily. 02/15/21  Yes Jacalyn Lefevre, MD  cephALEXin (KEFLEX) 500 MG capsule Take 1 capsule (500 mg total) by mouth 4 (four) times daily. 09/15/19   Ivery Quale, PA-C    Allergies    Patient has no known allergies.  Review of Systems   Review of Systems  Musculoskeletal: Positive for back pain.  All other systems reviewed and are negative.   Physical Exam Updated Vital Signs BP 120/80 (BP Location: Right Arm)   Pulse 85   Temp 98.7 F (37.1 C) (Oral)   Resp 16   Ht 5\' 10"  (1.778 m)   Wt 77.1 kg   SpO2 97%   BMI 24.39 kg/m   Physical  Exam Vitals and nursing note reviewed.  Constitutional:      Appearance: Normal appearance.  HENT:     Head: Normocephalic and atraumatic.     Right Ear: External ear normal.     Left Ear: External ear normal.     Nose: Nose normal.     Mouth/Throat:     Mouth: Mucous membranes are moist.     Pharynx: Oropharynx is clear.  Eyes:     Extraocular Movements: Extraocular movements intact.     Conjunctiva/sclera: Conjunctivae normal.     Pupils: Pupils are equal, round, and reactive to light.  Cardiovascular:     Rate and Rhythm: Normal rate and regular rhythm.     Pulses: Normal pulses.     Heart sounds: Normal heart sounds.  Pulmonary:     Effort: Pulmonary effort is normal.     Breath sounds: Normal breath sounds.  Abdominal:     General: Abdomen is flat. Bowel sounds are normal.     Palpations: Abdomen is soft.  Musculoskeletal:     Cervical back: Normal range of motion and neck supple.  Back:  Skin:    General: Skin is warm.     Capillary Refill: Capillary refill takes less than 2 seconds.  Neurological:     General: No focal deficit present.     Mental Status: He is alert.  Psychiatric:        Mood and Affect: Mood normal.        Behavior: Behavior normal.     ED Results / Procedures / Treatments   Labs (all labs ordered are listed, but only abnormal results are displayed) Labs Reviewed - No data to display  EKG None  Radiology No results found.  Procedures Procedures   Medications Ordered in ED Medications  ketorolac (TORADOL) 30 MG/ML injection 30 mg (has no administration in time range)    ED Course  I have reviewed the triage vital signs and the nursing notes.  Pertinent labs & imaging results that were available during my care of the patient were reviewed by me and considered in my medical decision making (see chart for details).    MDM Rules/Calculators/A&P                          Pt is ambulatory.  No red flags.  Pt stable for d/c.   Return if worse.  Final Clinical Impression(s) / ED Diagnoses Final diagnoses:  Strain of lumbar region, initial encounter    Rx / DC Orders ED Discharge Orders         Ordered    ibuprofen (ADVIL) 600 MG tablet  Every 6 hours PRN        02/15/21 0737    methocarbamol (ROBAXIN) 500 MG tablet  2 times daily        02/15/21 0737           Jacalyn Lefevre, MD 02/15/21 [phone]

## 2021-02-15 NOTE — ED Triage Notes (Signed)
Pt c/o back pain that started yesterday morning. Denies any injury.

## 2021-05-07 IMAGING — DX DG FINGER THUMB 2+V*L*
3 series · 3 of 3 positions shown · non-contrast
Comparison: None.

CLINICAL DATA: Pt presents to ED with complaints of laceration to
right thumb nail from box cutter trying to cut bamboo. Pt states
after he cut his nail he passed out and hit his head.

EXAM:
LEFT THUMB 2+V

[finger ap]
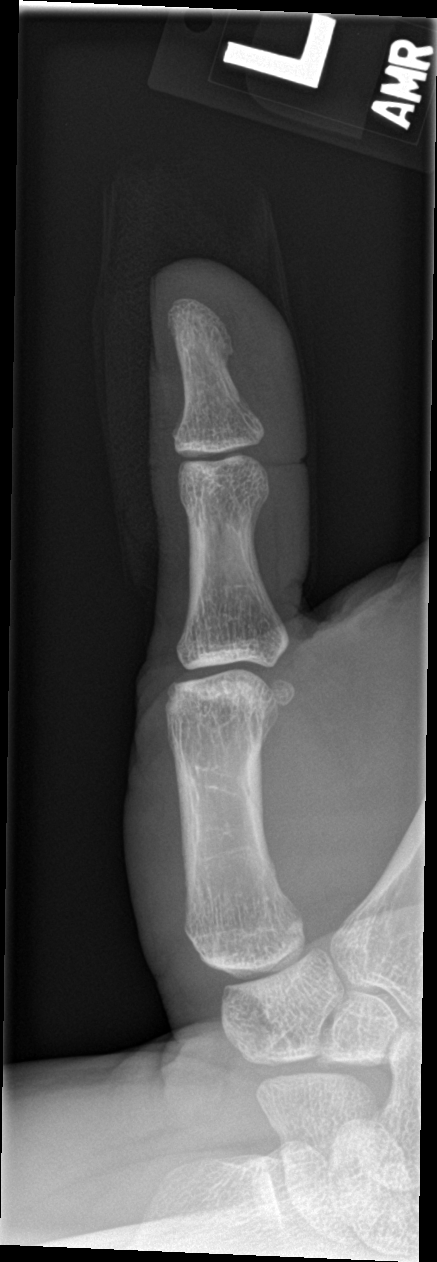

[finger obl]
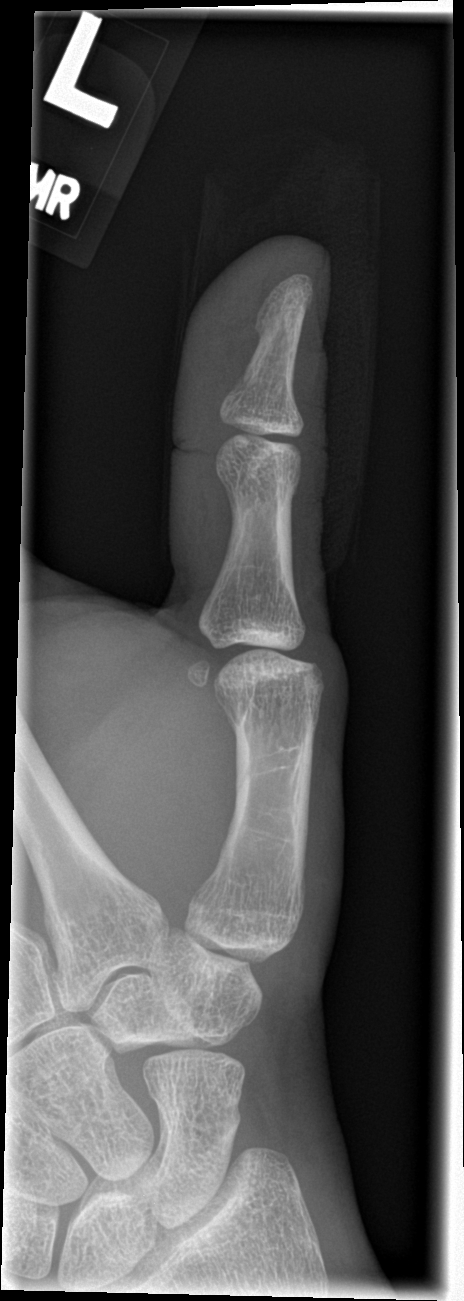

[finger lat]
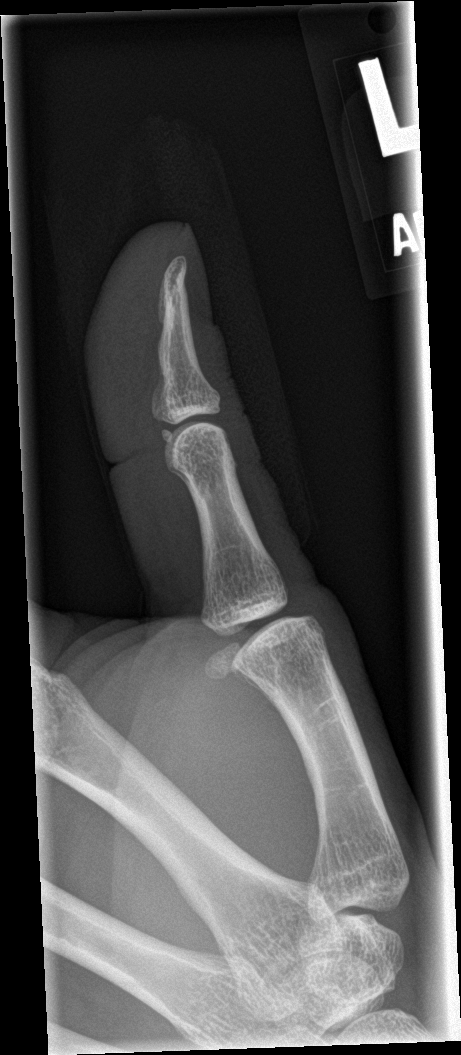

[3 of 3 positions shown; findings below may reference images not displayed]

FINDINGS: There is no evidence of fracture or dislocation. There is no
evidence of arthropathy or other focal bone abnormality. No
radiopaque foreign body in the regional soft tissues.
IMPRESSION: No acute osseous abnormality in the left thumb.

## 2024-01-12 DEATH — deceased
